# Patient Record
Sex: Female | Born: 1970 | Race: Black or African American | Hispanic: No | Marital: Married | Smoking: Never smoker
Health system: Southern US, Community
[De-identification: ages and names within clinical notes are randomized; demographics above are authoritative.]

---

## 2006-09-18 ENCOUNTER — Other Ambulatory Visit: Admission: RE | Admit: 2006-09-18 | Discharge: 2006-09-18 | Payer: Self-pay | Admitting: Obstetrics and Gynecology

## 2011-10-15 ENCOUNTER — Ambulatory Visit (INDEPENDENT_AMBULATORY_CARE_PROVIDER_SITE_OTHER): Payer: BC Managed Care – PPO | Admitting: Family Medicine

## 2011-10-15 VITALS — BP 124/66 | HR 65 | Temp 98.6°F | Resp 16 | Ht 68.5 in | Wt 215.4 lb

## 2011-10-15 DIAGNOSIS — R51 Headache: Secondary | ICD-10-CM

## 2011-10-15 DIAGNOSIS — B009 Herpesviral infection, unspecified: Secondary | ICD-10-CM

## 2011-10-15 MED ORDER — VALACYCLOVIR HCL 1 G PO TABS: 1000.0000 mg | ORAL_TABLET | Freq: Three times a day (TID) | ORAL | Status: DC

## 2011-10-15 NOTE — Progress Notes (Signed)
  Patient Name: Lindsay Mendez Date of Birth: 1970/11/14 Medical Record Number: [id number] Gender: female Date of Encounter: 10/15/2011  History of Present Illness:  Lindsay Mendez is a 41 y.o. very pleasant female patient who presents with the following:  She has a couple of concerns today 1. She notes a tender place in the center of her scalp for close to a month.  It is itchy and stings, but is tender to the touch.  She thinks there may have been a lesion or rash present, but this is now resolved.  2. This morning she noted blisters on the right side of her mouth, and also noted sore nodes on the right side of her neck .  She has had fever blisters in the past but not usually this bad.    Generally healthy.  LMP 09/28/11  There is no problem list on file for this patient.  No past medical history on file. No past surgical history on file. History  Substance Use Topics  . Smoking status: Never Smoker   . Smokeless tobacco: Not on file  . Alcohol Use: Not on file   No family history on file. Allergies  Allergen Reactions  . Penicillins Hives  . Ampicillin Hives  . Eggs Or Egg-Derived Products Nausea And Vomiting  . Septra (Bactrim)   . Sulfa Antibiotics Hives    Medication list has been reviewed and updated.  Review of Systems: As per HPI- otherwise negative. Of note no eye symptoms or pain   Physical Examination: Filed Vitals:   10/15/11 1617  BP: 124/66  Pulse: 65  Temp: 98.6 F (37 C)  TempSrc: Oral  Resp: 16  Height: 5' 8.5" (1.74 m)  Weight: 215 lb 6.4 oz (97.705 kg)    Body mass index is 32.28 kg/(m^2).  GEN: WDWN, NAD, Non-toxic, A & O x 3 HEENT: Atraumatic, Normocephalic. Neck supple. No masses, small tender nodes right cervical chain.  PEERL, eyes wnl.  Oropharynx and TM wnl also.   Ears and Nose: No external deformity. CV: RRR, No M/G/R. No JVD. No thrill. No extra heart sounds. PULM: CTA B, no wheezes, crackles, rhonchi. No retractions. No resp.  distress. No accessory muscle use. ABD: S, NT, ND EXTR: No c/c/e NEURO Normal gait.  PSYCH: Normally interactive. Conversant. Not depressed or anxious appearing.  Calm demeanor.  Cold sore right side of mouth.  Examined scalp and I can find no abnormality, lesion, or bump  Assessment and Plan: 1. 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  Therefore, will treat with valtrex at shingles dose.  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   She is to call if her scalp is not feeling better by the end of the week- Sooner if worse.

## 2011-10-21 ENCOUNTER — Encounter (HOSPITAL_COMMUNITY): Payer: Self-pay | Admitting: *Deleted

## 2011-10-21 ENCOUNTER — Emergency Department (HOSPITAL_COMMUNITY)
Admission: EM | Admit: 2011-10-21 | Discharge: 2011-10-22 | Disposition: A | Discharge status: Home / Self Care | Payer: BC Managed Care – PPO | Attending: Emergency Medicine | Admitting: Emergency Medicine

## 2011-10-21 DIAGNOSIS — S161XXA Strain of muscle, fascia and tendon at neck level, initial encounter: Secondary | ICD-10-CM

## 2011-10-21 DIAGNOSIS — S139XXA Sprain of joints and ligaments of unspecified parts of neck, initial encounter: Secondary | ICD-10-CM | POA: Insufficient documentation

## 2011-10-21 DIAGNOSIS — X58XXXA Exposure to other specified factors, initial encounter: Secondary | ICD-10-CM | POA: Insufficient documentation

## 2011-10-21 NOTE — ED Notes (Signed)
She has had a stiffneck since Monday.  She has severe pain with movement.  She was seen by her doctor and given med.  That med is not helping

## 2011-10-22 MED ORDER — IBUPROFEN 600 MG PO TABS: 600.0000 mg | ORAL_TABLET | Freq: Three times a day (TID) | ORAL | Status: AC | PRN

## 2011-10-22 MED ORDER — DIAZEPAM 5 MG PO TABS
5.0000 mg | ORAL_TABLET | Freq: Once | ORAL | Status: AC
  Administered 2011-10-22: 5 mg via ORAL
  Filled 2011-10-22: qty 1

## 2011-10-22 MED ORDER — HYDROCODONE-ACETAMINOPHEN 5-500 MG PO TABS: 1.0000 | ORAL_TABLET | Freq: Four times a day (QID) | ORAL | Status: AC | PRN

## 2011-10-22 MED ORDER — CYCLOBENZAPRINE HCL 10 MG PO TABS: 5.0000 mg | ORAL_TABLET | Freq: Three times a day (TID) | ORAL | Status: AC | PRN

## 2011-10-22 MED ORDER — OXYCODONE-ACETAMINOPHEN 5-325 MG PO TABS
1.0000 | ORAL_TABLET | Freq: Once | ORAL | Status: AC
  Administered 2011-10-22: 1 via ORAL
  Filled 2011-10-22: qty 1

## 2011-10-22 NOTE — ED Provider Notes (Signed)
History     CSN: [id number]  Arrival date & time 10/21/11  2223   First MD Initiated Contact with Patient 10/21/11 2359      Chief Complaint  Patient presents with  . neck stiffness      The history is provided by the patient.   the patient reports approximately one week of ongoing neck pain and neck stiffness in her left posterior neck and upper shoulder region.  She reports the pain is worse when she flexes at the neck and twists her head left and to the right.  She's had no recent trauma.  She's had no recent new exercise regimen.  She denies fevers or chills.  She has no weakness in her upper lower extremities.  She has no prior history of cancer or IV drug abuse.  She was seen by her physician prescribed ibuprofen.  She reports it helps with the pain but then the pain returns and she is unsure why the pain is returning.  She denies chest pain shortness of breath.  She denies exertional worsening of her symptoms.  Her pain is moderate at this time.  Nothing improves her symptoms  History reviewed. No pertinent past medical history.  History reviewed. No pertinent past surgical history.  No family history on file.  History  Substance Use Topics  . Smoking status: Never Smoker   . Smokeless tobacco: Not on file  . Alcohol Use: No    OB History    Grav Para Term Preterm Abortions TAB SAB Ect Mult Living                  Review of Systems  All other systems reviewed and are negative.    Allergies  Penicillins; Ampicillin; Eggs or egg-derived products; Septra; and Sulfa antibiotics  Home Medications   Current Outpatient Rx  Name Route Sig Dispense Refill  . IBUPROFEN 200 MG PO TABS Oral Take 400 mg by mouth every 6 (six) hours as needed. For pain    . CYCLOBENZAPRINE HCL 10 MG PO TABS Oral Take 0.5 tablets (5 mg total) by mouth 3 (three) times daily as needed for muscle spasms. 12 tablet 0  . HYDROCODONE-ACETAMINOPHEN 5-500 MG PO TABS Oral Take 1 tablet by mouth every  6 (six) hours as needed for pain. 20 tablet 0  . IBUPROFEN 600 MG PO TABS Oral Take 1 tablet (600 mg total) by mouth every 8 (eight) hours as needed for pain. 15 tablet 0    BP 115/60  Pulse 80  Temp(Src) 98.1 F (36.7 C) (Oral)  Resp 20  SpO2 100%  LMP 09/25/2011  Physical Exam  Nursing note and vitals reviewed. Constitutional: She is oriented to person, place, and time. She appears well-developed and well-nourished. No distress.  HENT:  Head: Normocephalic and atraumatic.  Eyes: EOM are normal.  Neck: Normal range of motion. Neck supple. No thyromegaly present.       Mild tenderness of left paracervical and upper left thoracic paraspinal muscles.  No cervical or thoracic spinal tenderness.  No rash noted  Cardiovascular: Normal rate, regular rhythm and normal heart sounds.   Pulmonary/Chest: Effort normal and breath sounds normal. No stridor.  Abdominal: Soft. She exhibits no distension. There is no tenderness.  Musculoskeletal: Normal range of motion.  Lymphadenopathy:    She has no cervical adenopathy.  Neurological: She is alert and oriented to person, place, and time.       5 out of 5 strength in bilateral upper  extremity major muscle groups  Skin: Skin is warm and dry.  Psychiatric: She has a normal mood and affect. Judgment normal.    ED Course  Procedures (including critical care time)  Labs Reviewed - No data to display No results found.   1. Cervical strain       MDM  Her symptoms seem consistent with cervical strain.  This is not an anginal equivalent.  She has normal strength in her upper extremities she walked in the ER without difficulty.  This is not a spinal cord issue.  Home with pain medicine and muscle relaxants and PCP followup.  She understands to return the emergency department for new or worsening symptoms        Lyanne Co, MD 10/22/11 0030

## 2011-10-22 NOTE — Discharge Instructions (Signed)

## 2012-05-08 ENCOUNTER — Ambulatory Visit (INDEPENDENT_AMBULATORY_CARE_PROVIDER_SITE_OTHER): Payer: BC Managed Care – PPO | Admitting: Family Medicine

## 2012-05-08 VITALS — BP 122/68 | HR 70 | Temp 98.1°F | Resp 16 | Ht 69.5 in | Wt 215.6 lb

## 2012-05-08 DIAGNOSIS — K047 Periapical abscess without sinus: Secondary | ICD-10-CM

## 2012-05-08 DIAGNOSIS — K0510 Chronic gingivitis, plaque induced: Secondary | ICD-10-CM

## 2012-05-08 MED ORDER — CLINDAMYCIN HCL 300 MG PO CAPS: 300.0000 mg | ORAL_CAPSULE | Freq: Three times a day (TID) | ORAL | Status: DC

## 2012-05-08 MED ORDER — TRAMADOL HCL 50 MG PO TABS: 50.0000 mg | ORAL_TABLET | Freq: Three times a day (TID) | ORAL | Status: DC | PRN

## 2012-05-08 MED ORDER — CLINDAMYCIN HCL 150 MG PO CAPS: 150.0000 mg | ORAL_CAPSULE | Freq: Three times a day (TID) | ORAL | Status: DC

## 2012-05-08 NOTE — Progress Notes (Signed)
[facility]:  Office Visit  Chief Complaint:  Chief Complaint  Patient presents with  . Dental Pain    Right upper side x 3dys  . Facial Swelling    x 3 dys  . Throat pain    when rying to swallow x 3 dys    HPI: Lindsay Mendez is a 41 y.o. female who complains of right sided jaw and dental pain since Monday. Has not tried anything for it except regular mouth wash. Has problems eating. No dentist. Denies SOB, CP, fevers, chills. Has tooth radiating into right jaw.   History reviewed. No pertinent past medical history. History reviewed. No pertinent past surgical history. History   Social History  . Marital Status: Unknown    Spouse Name: N/A    Number of Children: N/A  . Years of Education: N/A   Social History Main Topics  . Smoking status: Never Smoker   . Smokeless tobacco: None  . Alcohol Use: No  . Drug Use: No  . Sexually Active: Yes   Other Topics Concern  . None   Social History Narrative  . None   No family history on file. Allergies  Allergen Reactions  . Penicillins Hives  . Ampicillin Hives  . Eggs Or Egg-Derived Products Nausea And Vomiting  . Septra (Bactrim) Hives  . Sulfa Antibiotics Hives   Prior to Admission medications   Medication Sig Start Date End Date Taking? Authorizing Provider  ibuprofen (ADVIL,MOTRIN) 200 MG tablet Take 400 mg by mouth every 6 (six) hours as needed. For pain   Yes Historical Provider, MD  clindamycin (CLEOCIN) 150 MG capsule Take 1 capsule (150 mg total) by mouth 3 (three) times daily. 05/08/12   Elvie Maines P Michaelina Blandino, DO  clindamycin (CLEOCIN) 300 MG capsule Take 1 capsule (300 mg total) by mouth 3 (three) times daily. 05/08/12   Tonetta Napoles P Lavayah Vita, DO  traMADol (ULTRAM) 50 MG tablet Take 1 tablet (50 mg total) by mouth every 8 (eight) hours as needed for pain. 05/08/12   Herminio Kniskern P Itzelle Gains, DO     ROS: The patient denies fevers, chills, night sweats, unintentional weight loss, chest pain, palpitations, wheezing, dyspnea on  exertion, nausea, vomiting, abdominal pain, dysuria, hematuria, melena, numbness, weakness, or tingling.   All other systems have been reviewed and were otherwise negative with the exception of those mentioned in the HPI and as above.    PHYSICAL EXAM: Filed Vitals:   05/08/12 0949  BP: 122/68  Pulse: 70  Temp: 98.1 F (36.7 C)  Resp: 16   Filed Vitals:   05/08/12 0949  Height: 5' 9.5" (1.765 m)  Weight: 215 lb 9.6 oz (97.796 kg)   Body mass index is 31.38 kg/(m^2).  General: Alert, no acute distress HEENT:  Normocephalic, atraumatic, oropharynx patent. Tight upper molar gingivitis and dental caries. No SOB. No tonsillar swelling/edema.  Cardiovascular:  Regular rate and rhythm, no rubs murmurs or gallops.  No Carotid bruits, radial pulse intact. No pedal edema.  Respiratory: Clear to auscultation bilaterally.  No wheezes, rales, or rhonchi.  No cyanosis, no use of accessory musculature GI: No organomegaly, abdomen is soft and non-tender, positive bowel sounds.  No masses. Skin: No rashes. Neurologic: Facial musculature symmetric. Psychiatric: Patient is appropriate throughout our interaction. Lymphatic: No cervical lymphadenopathy Musculoskeletal: Gait intact.   LABS: No results found for this or any previous visit.   EKG/XRAY:   Primary read interpreted by Dr. Conley Rolls at [facility]   ASSESSMENT/PLAN: Encounter Diagnoses  Name Primary?  . Dental abscess Yes  . Gingivitis    Patient has PCN and Sulfa allergy Will give Clindamycin 450 mg PO TID x 10 days. We do not have 450 mg pills on out computer script so will give 150 mg and 300 mg for a total of 450 mg.  Tramdol for pain, salt water gargles Go see a dentist ASAP when swelling goes down If fever, more pain then f/u prn Consider changing to Doxycycline if Clindamycin is not effective?     Airiel Oblinger PHUONG, DO 05/08/2012 10:49 AM

## 2012-09-15 ENCOUNTER — Ambulatory Visit (INDEPENDENT_AMBULATORY_CARE_PROVIDER_SITE_OTHER): Payer: BC Managed Care – PPO | Admitting: Family Medicine

## 2012-09-15 VITALS — BP 134/84 | HR 82 | Temp 98.3°F | Resp 16 | Ht 69.5 in | Wt 221.4 lb

## 2012-09-15 NOTE — Patient Instructions (Signed)
We are going to refer you to an audiologist to help figure out the rining in your ear.   At that point we may need to send you to an Ear, Nose, and Throat doctor if you need it, depending on what they find.    For your headache, I think you need to have your eyes checked.  For the pain, continue taking the Ibuprofen for relief.

## 2012-09-15 NOTE — Progress Notes (Signed)
Lindsay Mendez is a 42 y.o. female who presents to [facility] Care today with complaints of headache and tinnitus:    1.  Headache:  Present for past 2-3 months.  Describes occipital pain occasionally radiates to frontal region of head.  Relieved with Ibuprofen.  Worse with driving, especially at night.  Sometimes has had some blurriness in her vision not resolved with her eyeglasses.  Still has to squint to read.  She had new glasses in December which were bifocals and she has been having trouble with headaches since then.  No changes in vision, nausea, vomiting.  Has headaches 2-3 times a week, lasts for an hour or so, then resolves on its own.    2.  Tinnitus:  Present for past 2-3 weeks.  Initially intermittent, now steady "ringing in my ears."  Not worse on either side.  No vertigo.  No ear pain.  Not pulsatile.  No recent illnesses.  No loss of hearing that she can tell.   PMH reviewed.  History reviewed. No pertinent past medical history. Past Surgical History  Procedure Laterality Date  . Cesarean section      Medications reviewed. Current Outpatient Prescriptions  Medication Sig Dispense Refill  . ibuprofen (ADVIL,MOTRIN) 200 MG tablet Take 400 mg by mouth every 6 (six) hours as needed. For pain      . clindamycin (CLEOCIN) 150 MG capsule Take 1 capsule (150 mg total) by mouth 3 (three) times daily.  30 capsule  0  . clindamycin (CLEOCIN) 300 MG capsule Take 1 capsule (300 mg total) by mouth 3 (three) times daily.  30 capsule  0  . traMADol (ULTRAM) 50 MG tablet Take 1 tablet (50 mg total) by mouth every 8 (eight) hours as needed for pain.  30 tablet  0   No current facility-administered medications for this visit.    ROS as above otherwise neg.  No chest pain, palpitations, SOB, Fever, Chills, Abd pain, N/V/D.   Physical Exam:  BP 134/84  Pulse 82  Temp(Src) 98.3 F (36.8 C) (Oral)  Resp 16  Ht 5' 9.5" (1.765 m)  Wt 221 lb 6.4 oz (100.426 kg)  BMI 32.24 kg/m2  SpO2 100%  LMP  08/18/2012 Gen:  Patient sitting on exam table, appears stated age in no acute distress Head: Normocephalic atraumatic Eyes: EOMI, PERRL, sclera and conjunctiva non-erythematous.  Fundoscopy WNL BL with clear cup to disc margins.     Ears:  Canals clear bilaterally.  TMs pearly gray bilaterally without erythema or bulging.  Auditory acuity equal bilaterally to whisper test and finger rub Nose:  Nasal turbinates without edema BL Mouth: Mucosa membranes moist. Tonsils +2, nonenlarged, non-erythematous. Neck: No cervical lymphadenopathy noted Heart:  RRR, no murmurs auscultated. Pulm:  Clear to auscultation bilaterally with good air movement.  No wheezes or rales noted.      Assessment and Plan:  1.  Headache: - No worrisome findings or red flags. - Continue Ibuprofen as this has helped in past.  - Recommend visit to optometrist to have vision checked as this seems to be trigger for headaches.   2. Tinnitus: - likely secondary to hearing loss - referral to audiology placed today. - unclear etiology, may eventually need ENT - no worrisome findings/symptoms.   - discussed diagnosis with patient and no treatment for now.

## 2012-09-18 ENCOUNTER — Ambulatory Visit (INDEPENDENT_AMBULATORY_CARE_PROVIDER_SITE_OTHER): Payer: BC Managed Care – PPO | Admitting: Family Medicine

## 2012-09-18 VITALS — BP 112/67 | HR 78 | Temp 98.1°F | Resp 18 | Ht 69.5 in | Wt 219.0 lb

## 2012-09-18 DIAGNOSIS — R51 Headache: Secondary | ICD-10-CM

## 2012-09-18 DIAGNOSIS — H9312 Tinnitus, left ear: Secondary | ICD-10-CM

## 2012-09-18 DIAGNOSIS — H9319 Tinnitus, unspecified ear: Secondary | ICD-10-CM

## 2012-09-18 MED ORDER — TRAMADOL HCL 50 MG PO TABS: 50.0000 mg | ORAL_TABLET | Freq: Three times a day (TID) | ORAL | Status: DC | PRN

## 2012-09-18 MED ORDER — TIZANIDINE HCL 2 MG PO TABS: ORAL_TABLET | ORAL | Status: DC

## 2012-09-18 NOTE — Progress Notes (Signed)
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 She started having this late last week, and symptoms have continued to persist. She has a left occipitoparietal headache down into her neck some. She's had a re\re in her ear. This is continued to persist. She has an appointment to see the audiologist next week. She came in here and was seen 3 days ago, felt not to have a migraine, and was treated with the ibuprofen it with a referral as noted. She's not been working the last few days. She has a history of having had headaches back in her school days. This is not a totally new phenomena to her, though the tkinnitus is different. She does admit distress but that was more back in December. She quit working at that time and has been on a break since then.  Objective: Patient appears to be in some discomfort. Her TMs appeared entirely normal. Eyes PERRLA. Fundi appear benign though I could not get any thorough look at the discs, only got brief glance is at them. Her throat was clear. Neck supple without nodes. Chest clear. Heart regular without murmurs. Cranial nerves intact. Motor symmetrical. Sensory grossly normal. Romberg negative.  Assessment: Headache, atypical in its prolonged nature and the associated ringing in the ears  Plan: Tramadol Tizanidine Due to the atypical nature of the headache I'm going ahead and trying to order an MRI on her.

## 2012-09-18 NOTE — Patient Instructions (Addendum)
Take the tramadol every 6 or 8 hours as needed for headaches.  Take the tizanidine one at bedtime. If well tolerated take one in the morning also. It will often make you drowsy.  We will schedule a brain scan for you, MRI, and someone will contact you about that in the next day or so.  If worse at anytime return here or go to the emergency room. If not feeling much better in 3 or 4 days return for recheck.

## 2012-10-02 ENCOUNTER — Telehealth: Payer: Self-pay | Admitting: Radiology

## 2012-10-02 NOTE — Telephone Encounter (Signed)
THIS PATIENT CAME TO OUR DOOR AFTER HOURS. SHE PRESENTED WITH EAR RINGING AND SINUS PRESSURE. SHE MENTIONED SHE MIGHT HAVE CHEST PAIN. UNDER THE DIRECTION OF SARAH WEBER, WE OFFERED TO CHECK HER VITAL SIGNS AND CALL AN AMBULANCE FOR HER, IF WARRANTED. SHE AGREED. HER VITALS WERE: B/P 120/78, PULSE 88, PULSE OX: 100%. WE DID TELL HER THAT HER VITALS WERE STABLE, BUT IF SHE FELT SHE NEEDED TREATMENT SHE SHOULD GO TO THE EMERGENCY ROOM. WE WOULD BE HAPPY TO SEE HER IN THE MORNING AT 7:30 AM. THE PATIENT LEFT WITH HER HUSBAND UNDERSTANDING OUR CONVERSATION.                           [facility] WITHERSPOON, R.T.(R)

## 2012-10-08 ENCOUNTER — Other Ambulatory Visit: Payer: Self-pay

## 2012-10-08 DIAGNOSIS — R51 Headache: Secondary | ICD-10-CM

## 2012-10-09 ENCOUNTER — Other Ambulatory Visit: Payer: Self-pay | Admitting: Otolaryngology

## 2012-10-09 DIAGNOSIS — R51 Headache: Secondary | ICD-10-CM

## 2012-10-10 ENCOUNTER — Other Ambulatory Visit: Payer: Self-pay | Admitting: Otolaryngology

## 2012-10-10 ENCOUNTER — Other Ambulatory Visit: Payer: BC Managed Care – PPO

## 2012-10-10 ENCOUNTER — Inpatient Hospital Stay: Admission: RE | Admit: 2012-10-10 | Payer: BC Managed Care – PPO | Source: Ambulatory Visit

## 2012-10-10 ENCOUNTER — Ambulatory Visit
Admission: RE | Admit: 2012-10-10 | Discharge: 2012-10-10 | Disposition: A | Discharge status: Home / Self Care | Payer: Self-pay | Source: Ambulatory Visit | Attending: Otolaryngology | Admitting: Otolaryngology

## 2012-10-10 DIAGNOSIS — H9319 Tinnitus, unspecified ear: Secondary | ICD-10-CM

## 2012-10-10 DIAGNOSIS — R51 Headache: Secondary | ICD-10-CM

## 2013-07-15 ENCOUNTER — Ambulatory Visit: Payer: BC Managed Care – PPO | Admitting: Family Medicine

## 2013-07-15 VITALS — BP 110/70 | HR 83 | Temp 99.9°F | Resp 16 | Ht 69.5 in | Wt 228.0 lb

## 2013-07-15 DIAGNOSIS — N926 Irregular menstruation, unspecified: Secondary | ICD-10-CM

## 2013-07-15 DIAGNOSIS — J209 Acute bronchitis, unspecified: Secondary | ICD-10-CM

## 2013-07-15 LAB — POCT URINE PREGNANCY: Preg Test, Ur: NEGATIVE

## 2013-07-15 MED ORDER — AZITHROMYCIN 250 MG PO TABS: ORAL_TABLET | ORAL | Status: DC

## 2013-07-15 MED ORDER — BENZONATATE 200 MG PO CAPS: 200.0000 mg | ORAL_CAPSULE | Freq: Three times a day (TID) | ORAL | Status: DC | PRN

## 2013-07-15 NOTE — Progress Notes (Signed)
°  This chart was scribed for Lindsay Sidle, MD by [name], ED Scribe. This patient was seen in room Room/bed 3 and the patient's care was started at 12:03 PM. Subjective:    Patient ID: Lindsay Mendez, female    DOB: 1971-01-01, 42 y.o.   MRN: [id number] Chief Complaint  Patient presents with   Cough    x 3 days   Fever   Possible Pregnancy   HPI Lindsay Mendez is a 42 y.o. female who presents to the [facility] complaining of ongoing cough, fever, and myalgias with a associated slight rhinorrhea that began 3 days ago. Pt states she was sick 2 weeks ago and states her symptoms have returned and worsened. Pt states she is having R otalgia and chest pain. She states she is having sore throat and states she is having dryness of the throat. Pt states she was taking OTC Tylenol Sinus for a week and had some relief.  Pt suspects she maybe pregnant. She states she has not had a menstrual cycle since October 2014.  Pt states she will begin working with [employer] [employer].  Review of Systems  Constitutional: Positive for fever.  HENT: Positive for ear pain and rhinorrhea.   Respiratory: Positive for cough.   Cardiovascular: Positive for chest pain.  Musculoskeletal: Positive for myalgias.   Objective:   Physical Exam  Nursing note and vitals reviewed. Constitutional: She is oriented to person, place, and time. She appears well-developed and well-nourished. No distress.  HENT:  Head: Normocephalic and atraumatic.  Right Ear: External ear normal.  Left Ear: External ear normal.  R TM dull in color.  Eyes: Conjunctivae are normal. Pupils are equal, round, and reactive to light.  Neck: Normal range of motion. Neck supple. No thyromegaly present.  Cardiovascular: Normal rate, regular rhythm and normal heart sounds.   No murmur heard. Pulmonary/Chest: Effort normal and breath sounds normal. She has no wheezes.  Few rhonchi in chest.  Abdominal: Bowel sounds are normal.  She exhibits no mass. There is no rebound and no guarding.  Musculoskeletal: Normal range of motion. She exhibits no tenderness.  Lymphadenopathy:    She has no cervical adenopathy.  Neurological: She is alert and oriented to person, place, and time. She has normal reflexes.  Skin: Skin is warm and dry. She is not diaphoretic.  Psychiatric: She has a normal mood and affect. Her behavior is normal.   Patient often misses her period this time years because of stress BP 110/70   Pulse 83   Temp(Src) 99.9 F (37.7 C) (Oral)   Resp 16   Ht 5' 9.5" (1.765 m)   Wt 228 lb (103.42 kg)   BMI 33.20 kg/m2   SpO2 99%   LMP 05/14/2013 Assessment & Plan:   1. Acute bronchitis   2. Missed period     Acute bronchitis - Plan: azithromycin (ZITHROMAX Z-PAK) 250 MG tablet, benzonatate (TESSALON) 200 MG capsule  Missed period - Plan: POCT urine pregnancy  Signed, Lindsay Sidle, MD

## 2013-07-15 NOTE — Patient Instructions (Signed)

## 2014-09-17 ENCOUNTER — Ambulatory Visit (INDEPENDENT_AMBULATORY_CARE_PROVIDER_SITE_OTHER): Payer: Self-pay | Admitting: Family Medicine

## 2014-09-17 VITALS — BP 118/72 | HR 73 | Temp 97.9°F | Resp 18 | Ht 69.5 in | Wt 231.0 lb

## 2014-09-17 DIAGNOSIS — Z0000 Encounter for general adult medical examination without abnormal findings: Secondary | ICD-10-CM

## 2014-09-17 NOTE — Progress Notes (Signed)
   Subjective:    Patient ID: Lindsay Mendez, female    DOB: 11/13/1970, 44 y.o.   MRN: [id number]  This chart was scribed for Elvina SidleKurt Reginal Wojcicki, MD, by Lindsay Cosieronney LionSuzanne Le, ED Scribe. This patient was seen in room 11 and the patient's care was started at 11:02 AM.   Chief Complaint  Patient presents with  . Employment Physical    has form     HPI HPI Comments: Lindsay Mendez is a 44 y.o. female who presents to the [facility] for an employment physical. She is asymptomatic at this time.   Patient however reports having had amenorrhea for the past 6-8 months. She thinks it may be due to the stress of her mom passing and other life stresses.  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 Patient is from [place].   Review of Systems  Genitourinary: Positive for menstrual problem.       Patient reports irregular periods for the past 6-8 months.       Objective:   Physical Exam  Constitutional: She is oriented to person, place, and time. She appears well-developed and well-nourished. No distress.  HENT:  Head: Normocephalic and atraumatic.  Eyes: Conjunctivae and EOM are normal.  Neck: Neck supple. No tracheal deviation present.  Cardiovascular: Normal rate.   Pulmonary/Chest: Effort normal. No respiratory distress.  Abdominal: Soft. She exhibits no mass. There is no hepatomegaly. There is no tenderness.  Musculoskeletal: Normal range of motion.  Neurological: She is alert and oriented to person, place, and time.  Skin: Skin is warm and dry.  Psychiatric: She has a normal mood and affect. Her behavior is normal.  Nursing note and vitals reviewed.      Assessment & Plan:     This chart was scribed in my presence and reviewed by me personally.    ICD-9-CM ICD-10-CM   1. Annual physical exam V70.0 Z00.00      Signed, Elvina SidleKurt Karin Pinedo, MD

## 2014-09-17 NOTE — Patient Instructions (Signed)

## 2014-09-17 NOTE — Progress Notes (Signed)
  Tuberculosis Risk Questionnaire  1. Yes  Were you born outside the [place] in one of the following parts of the world: [place], [place], [place], [place] America or Eastern [place]?    2. Yes  Have you traveled outside the [place] and lived for more than one month in one of the following parts of the world: [place], [place], [place], [place] or [place]?    3. No Do you have a compromised immune system such as from any of the following conditions:HIV/AIDS, organ or bone marrow transplantation, diabetes, immunosuppressive medicines (e.g. Prednisone, Remicaide), leukemia, lymphoma, cancer of the head or neck, gastrectomy or jejunal bypass, end-stage renal disease (on dialysis), or silicosis?     4. No Have you ever or do you plan on working in: a residential care center, a health care facility, a jail or prison or homeless shelter?    5. No Have you ever: injected illegal drugs, used crack cocaine, lived in a homeless shelter  or been in jail or prison?     6. No Have you ever been exposed to anyone with infectious tuberculosis?    Tuberculosis Symptom Questionnaire  Do you currently have any of the following symptoms?  1. No Unexplained cough lasting more than 3 weeks?   2. No Unexplained fever lasting more than 3 weeks.   3. No Night Sweats (sweating that leaves the bedclothes and sheets wet)     4. No Shortness of Breath   5. No  Chest Pain   6. No Unintentional weight loss    7. No Unexplained fatigue (very tired for no reason)   

## 2014-12-06 ENCOUNTER — Encounter: Payer: Self-pay | Admitting: Physician Assistant

## 2014-12-06 ENCOUNTER — Ambulatory Visit (INDEPENDENT_AMBULATORY_CARE_PROVIDER_SITE_OTHER): Payer: BLUE CROSS/BLUE SHIELD | Admitting: Physician Assistant

## 2014-12-06 ENCOUNTER — Ambulatory Visit (INDEPENDENT_AMBULATORY_CARE_PROVIDER_SITE_OTHER): Payer: BLUE CROSS/BLUE SHIELD

## 2014-12-06 VITALS — BP 112/72 | HR 59 | Temp 97.6°F | Resp 16 | Ht 69.54 in | Wt 238.0 lb

## 2014-12-06 DIAGNOSIS — M542 Cervicalgia: Secondary | ICD-10-CM | POA: Diagnosis not present

## 2014-12-06 DIAGNOSIS — N926 Irregular menstruation, unspecified: Secondary | ICD-10-CM

## 2014-12-06 DIAGNOSIS — M6248 Contracture of muscle, other site: Secondary | ICD-10-CM

## 2014-12-06 DIAGNOSIS — M62838 Other muscle spasm: Secondary | ICD-10-CM

## 2014-12-06 LAB — POCT URINE PREGNANCY: Preg Test, Ur: NEGATIVE

## 2014-12-06 MED ORDER — CYCLOBENZAPRINE HCL 5 MG PO TABS: 5.0000 mg | ORAL_TABLET | Freq: Three times a day (TID) | ORAL | Status: DC | PRN

## 2014-12-06 MED ORDER — DICLOFENAC SODIUM 75 MG PO TBEC: 75.0000 mg | DELAYED_RELEASE_TABLET | Freq: Two times a day (BID) | ORAL | Status: DC

## 2014-12-06 NOTE — Progress Notes (Signed)
Subjective:    Patient ID: Lindsay Mendez, female    DOB: 10/10/1970, 44 y.o.   MRN: [id number]  HPI Pt presents to clinic with right sided neck and shoulder muscle pain. She has had this over the past year but it is intermittent.  This current episode has been present for 3 days.  She was given robaxin for another muscle injury but it does not seem to be helping her current pain.  She does not have weakness or paresthesias in her right arm/hand.  She has had no injury that she knows of to her neck or her shoulder.  She had been walking and she found that when she walked this pain resolved and then over the last 3-4 months she has not been able to walk due to her schedule and her pain has gotten worse during this time.  Right handed  Teacher   Home treatments - motrin without help   Review of Systems  Musculoskeletal: Positive for neck pain.   There are no active problems to display for this patient.  Prior to Admission medications   Medication Sig Start Date End Date Taking? Authorizing Provider  ibuprofen (ADVIL,MOTRIN) 200 MG tablet Take 400 mg by mouth every 6 (six) hours as needed. For pain   Yes Historical Provider, MD  methocarbamol (ROBAXIN) 750 MG tablet Take 750 mg by mouth 4 (four) times daily.   Yes Historical Provider, MD   Allergies  Allergen Reactions  . Penicillins Hives  . Ampicillin Hives  . Eggs Or Egg-Derived Products Nausea And Vomiting  . Septra [Bactrim] Hives  . Sulfa Antibiotics Hives    Medications, allergies, past medical history, surgical history, family history, social history and problem list reviewed and updated.      Objective:   Physical Exam  Constitutional: She is oriented to person, place, and time. She appears well-developed and well-nourished.  BP 112/72 mmHg  Pulse 59  Temp(Src) 97.6 F (36.4 C) (Oral)  Resp 16  Ht 5' 9.54" (1.766 m)  Wt 238 lb (107.956 kg)  BMI 34.62 kg/m2  SpO2 97%  LMP 09/22/2014   HENT:  Head: Normocephalic  and atraumatic.  Right Ear: External ear normal.  Left Ear: External ear normal.  Pulmonary/Chest: Effort normal.  Musculoskeletal:       Right shoulder: Normal.       Cervical back: She exhibits tenderness (right sided) and spasm (trapezius TTP and muscle spasms palpable). She exhibits no bony tenderness.       Back:  Neurological: She is alert and oriented to person, place, and time.  Skin: Skin is warm and dry.  Psychiatric: She has a normal mood and affect. Her behavior is normal. Judgment and thought content normal.   [facility] reading (PRIMARY) by  Dr. Milus GlazierLauenstein. No disc space narrowing. decreased lordosis.      Assessment & Plan:  Neck pain - Plan: DG Cervical Spine 2 or 3 views, diclofenac (VOLTAREN) 75 MG EC tablet, Ambulatory referral to Chiropractic, CANCELED: DG CERVICAL SPINE 2 VIEW  Irregular menses - Plan: POCT urine pregnancy  Muscle spasms of neck - Plan: cyclobenzaprine (FLEXERIL) 5 MG tablet, Ambulatory referral to Chiropractic   Heat to the area and gentle stretches.  She will try and start walking. She will call her insurance to make sure she has coverage for chiropractic and if she does not have coverage she will want PT.  Benny LennertSarah Tishawna Larouche PA-C  [facility] 12/06/2014 11:14 AM

## 2014-12-06 NOTE — Addendum Note (Signed)
Addended by: Cydney OkAUGUSTIN, Yaeko Fazekas N on: 12/06/2014 12:12 PM   Modules accepted: Orders

## 2014-12-06 NOTE — Addendum Note (Signed)
Addended by: Cydney OkAUGUSTIN, Monica Codd N on: 12/06/2014 12:08 PM   Modules accepted: Orders

## 2014-12-06 NOTE — Patient Instructions (Signed)
[facility]  [facility]

## 2015-04-22 ENCOUNTER — Other Ambulatory Visit: Payer: Self-pay | Admitting: Obstetrics and Gynecology

## 2015-04-22 DIAGNOSIS — R928 Other abnormal and inconclusive findings on diagnostic imaging of breast: Secondary | ICD-10-CM

## 2015-04-29 ENCOUNTER — Ambulatory Visit
Admission: RE | Admit: 2015-04-29 | Discharge: 2015-04-29 | Disposition: A | Discharge status: Home / Self Care | Payer: BLUE CROSS/BLUE SHIELD | Source: Ambulatory Visit | Attending: Obstetrics and Gynecology | Admitting: Obstetrics and Gynecology

## 2015-04-29 DIAGNOSIS — R928 Other abnormal and inconclusive findings on diagnostic imaging of breast: Secondary | ICD-10-CM

## 2016-01-06 ENCOUNTER — Ambulatory Visit (INDEPENDENT_AMBULATORY_CARE_PROVIDER_SITE_OTHER): Payer: Self-pay | Admitting: Family Medicine

## 2016-01-06 VITALS — BP 124/78 | HR 73 | Temp 98.2°F | Resp 16 | Ht 69.54 in | Wt 246.0 lb

## 2016-01-06 DIAGNOSIS — R51 Headache: Secondary | ICD-10-CM

## 2016-01-06 DIAGNOSIS — R29898 Other symptoms and signs involving the musculoskeletal system: Secondary | ICD-10-CM

## 2016-01-06 DIAGNOSIS — R42 Dizziness and giddiness: Secondary | ICD-10-CM

## 2016-01-06 DIAGNOSIS — R600 Localized edema: Secondary | ICD-10-CM

## 2016-01-06 DIAGNOSIS — H9319 Tinnitus, unspecified ear: Secondary | ICD-10-CM

## 2016-01-06 DIAGNOSIS — R519 Headache, unspecified: Secondary | ICD-10-CM

## 2016-01-06 LAB — COMPREHENSIVE METABOLIC PANEL
ALBUMIN: 4 g/dL (ref 3.6–5.1)
ALK PHOS: 108 U/L (ref 33–115)
ALT: 11 U/L (ref 6–29)
AST: 18 U/L (ref 10–30)
BILIRUBIN TOTAL: 0.3 mg/dL (ref 0.2–1.2)
BUN: 10 mg/dL (ref 7–25)
CO2: 23 mmol/L (ref 20–31)
CREATININE: 0.58 mg/dL (ref 0.50–1.10)
Calcium: 8.8 mg/dL (ref 8.6–10.2)
Chloride: 107 mmol/L (ref 98–110)
Glucose, Bld: 79 mg/dL (ref 65–99)
Potassium: 4.2 mmol/L (ref 3.5–5.3)
SODIUM: 140 mmol/L (ref 135–146)
TOTAL PROTEIN: 6.6 g/dL (ref 6.1–8.1)

## 2016-01-06 LAB — POCT CBC
Granulocyte percent: 58.8 %G (ref 37–80)
HCT, POC: 31.9 % — AB (ref 37.7–47.9)
Hemoglobin: 11.3 g/dL — AB (ref 12.2–16.2)
LYMPH, POC: 1.2 (ref 0.6–3.4)
MCH, POC: 29.5 pg (ref 27–31.2)
MCHC: 35.6 g/dL — AB (ref 31.8–35.4)
MCV: 82.7 fL (ref 80–97)
MID (CBC): 0.2 (ref 0–0.9)
MPV: 7.5 fL (ref 0–99.8)
POC Granulocyte: 2.1 (ref 2–6.9)
POC LYMPH PERCENT: 34.7 %L (ref 10–50)
POC MID %: 6.5 % (ref 0–12)
Platelet Count, POC: 260 10*3/uL (ref 142–424)
RBC: 3.85 M/uL — AB (ref 4.04–5.48)
RDW, POC: 13.7 %
WBC: 3.6 10*3/uL — AB (ref 4.6–10.2)

## 2016-01-06 LAB — TSH: TSH: 1.67 mIU/L

## 2016-01-06 LAB — D-DIMER, QUANTITATIVE (NOT AT ARMC): D DIMER QUANT: 0.45 ug{FEU}/mL (ref <0.50)

## 2016-01-06 MED ORDER — DOXYCYCLINE HYCLATE 100 MG PO CAPS: 100.0000 mg | ORAL_CAPSULE | Freq: Two times a day (BID) | ORAL | Status: AC

## 2016-01-06 MED ORDER — PREDNISONE 20 MG PO TABS: ORAL_TABLET | ORAL | Status: AC

## 2016-01-06 MED ORDER — FLUTICASONE PROPIONATE 50 MCG/ACT NA SUSP: 2.0000 | Freq: Every day | NASAL | Status: AC

## 2016-01-06 MED ORDER — HYDROCODONE-ACETAMINOPHEN 5-325 MG PO TABS: 1.0000 | ORAL_TABLET | Freq: Four times a day (QID) | ORAL | Status: AC | PRN

## 2016-01-06 NOTE — Patient Instructions (Addendum)
IF you received an x-ray today, you will receive an invoice from [facility]. Please contact [facility] at [phone] with questions or concerns regarding your invoice.   IF you received labwork today, you will receive an invoice from [employer]. Please contact [facility] at [phone] with questions or concerns regarding your invoice.   Our billing staff will not be able to assist you with questions regarding bills from these companies.  You will be contacted with the lab results as soon as they are available. The fastest way to get your results is to activate your My Chart account. Instructions are located on the last page of this paperwork. If you have not heard from [place] regarding the results in 2 weeks, please contact this office.     Vertigo Vertigo means you feel like you or your surroundings are moving when they are not. Vertigo can be dangerous if it occurs when you are at work, driving, or performing difficult activities.  CAUSES  Vertigo occurs when there is a conflict of signals sent to your brain from the visual and sensory systems in your body. There are many different causes of vertigo, including:  Infections, especially in the inner ear.  A bad reaction to a drug or misuse of alcohol and medicines.  Withdrawal from drugs or alcohol.  Rapidly changing positions, such as lying down or rolling over in bed.  A migraine headache.  Decreased blood flow to the brain.  Increased pressure in the brain from a head injury, infection, tumor, or bleeding. SYMPTOMS  You may feel as though the world is spinning around or you are falling to the ground. Because your balance is upset, vertigo can cause nausea and vomiting. You may have involuntary eye movements (nystagmus). DIAGNOSIS  Vertigo is usually diagnosed by physical exam. If the cause of your vertigo is unknown, your caregiver may perform imaging tests, such as an MRI scan  (magnetic resonance imaging). TREATMENT  Most cases of vertigo resolve on their own, without treatment. Depending on the cause, your caregiver may prescribe certain medicines. If your vertigo is related to body position issues, your caregiver may recommend movements or procedures to correct the problem. In rare cases, if your vertigo is caused by certain inner ear problems, you may need surgery. HOME CARE INSTRUCTIONS   Follow your caregiver's instructions.  Avoid driving.  Avoid operating heavy machinery.  Avoid performing any tasks that would be dangerous to you or others during a vertigo episode.  Tell your caregiver if you notice that certain medicines seem to be causing your vertigo. Some of the medicines used to treat vertigo episodes can actually make them worse in some people. SEEK IMMEDIATE MEDICAL CARE IF:   Your medicines do not relieve your vertigo or are making it worse.  You develop problems with talking, walking, weakness, or using your arms, hands, or legs.  You develop severe headaches.  Your nausea or vomiting continues or gets worse.  You develop visual changes.  A family member notices behavioral changes.  Your condition gets worse. MAKE SURE YOU:  Understand these instructions.  Will watch your condition.  Will get help right away if you are not doing well or get worse.   This information is not intended to replace advice given to you by your health care provider. Make sure you discuss any questions you have with your health care provider.   Document Released: 04/18/2005 Document Revised: 10/01/2011 Document Reviewed: 11/01/2014 Elsevier Interactive Patient  Education 2016 [employer] Tinnitus Tinnitus refers to hearing a sound when there is no actual source for that sound. This is often described as ringing in the ears. However, people with this condition may hear a variety of noises. A person may hear the sound in one ear or in both ears.  The  sounds of tinnitus can be soft, loud, or somewhere in between. Tinnitus can last for a few seconds or can be constant for days. It may go away without treatment and come back at various times. When tinnitus is constant or happens often, it can lead to other problems, such as trouble sleeping and trouble concentrating. Almost everyone experiences tinnitus at some point. Tinnitus that is long-lasting (chronic) or comes back often is a problem that may require medical attention.  CAUSES  The cause of tinnitus is often not known. In some cases, it can result from other problems or conditions, including:   Exposure to loud noises from machinery, music, or other sources.  Hearing loss.  Ear or sinus infections.  Earwax buildup.  A foreign object in the ear.  Use of certain medicines.  Use of alcohol and caffeine.  High blood pressure.  Heart diseases.  Anemia.  Allergies.  Meniere disease.  Thyroid problems.  Tumors.  An enlarged part of a weakened blood vessel (aneurysm). SYMPTOMS The main symptom of tinnitus is hearing a sound when there is no source for that sound. It may sound like:   Buzzing.  Roaring.  Ringing.  Blowing air, similar to the sound heard when you listen to a seashell.  Hissing.  Whistling.  Sizzling.  Humming.  Running water.  A sustained musical note. DIAGNOSIS  Tinnitus is diagnosed based on your symptoms. Your health care provider will do a physical exam. A comprehensive hearing exam (audiologic exam) will be done if your tinnitus:   Affects only one ear (unilateral).  Causes hearing difficulties.  Lasts 6 months or longer. You may also need to see a health care provider who specializes in hearing disorders (audiologist). You may be asked to complete a questionnaire to determine the severity of your tinnitus. Tests may be done to help determine the cause and to rule out other conditions. These can include:  Imaging studies of your  head and brain, such as:  A CT scan.  An MRI.  An imaging study of your blood vessels (angiogram). TREATMENT  Treating an underlying medical condition can sometimes make tinnitus go away. If your tinnitus continues, other treatments may include:  Medicines, such as certain antidepressants or sleeping aids.  Sound generators to mask the tinnitus. These include:  Tabletop sound machines that play relaxing sounds to help you fall asleep.  Wearable devices that fit in your ear and play sounds or music.  A small device that uses headphones to deliver a signal embedded in music (acoustic neural stimulation). In time, this may change the pathways of your brain and make you less sensitive to tinnitus. This device is used for very severe cases when no other treatment is working.  Therapy and counseling to help you manage the stress of living with tinnitus.  Using hearing aids or cochlear implants, if your tinnitus is related to hearing loss. HOME CARE INSTRUCTIONS  When possible, avoid being in loud places and being exposed to loud sounds.  Wear hearing protection, such as earplugs, when you are exposed to loud noises.  Do not take stimulants, such as nicotine, alcohol, or caffeine.  Practice techniques for reducing  stress, such as meditation, yoga, or deep breathing.  Use a white noise machine, a humidifier, or other devices to mask the sound of tinnitus.  Sleep with your head slightly raised. This may reduce the impact of tinnitus.  Try to get plenty of rest each night. SEEK MEDICAL CARE IF:  You have tinnitus in just one ear.  Your tinnitus continues for 3 weeks or longer without stopping.  Home care measures are not helping.  You have tinnitus after a head injury.  You have tinnitus along with any of the following:  Dizziness.  Loss of balance.  Nausea and vomiting.   This information is not intended to replace advice given to you by your health care provider. Make  sure you discuss any questions you have with your health care provider.   Document Released: 07/09/2005 Document Revised: 07/30/2014 Document Reviewed: 12/09/2013 Elsevier Interactive Patient Education [employer]

## 2016-01-06 NOTE — Progress Notes (Deleted)
   Patient ID: Lindsay CosierIjeoma Mendez, female    DOB: 09/22/1970, 45 y.o.   MRN: [id number]  PCP: No PCP Per Patient  Subjective:   Chief Complaint  Patient presents with  . Dizziness  . Nausea    HPI Presents for evaluation of ***.  ***.    Review of Systems     There are no active problems to display for this patient.    Prior to Admission medications   Medication Sig Start Date End Date Taking? Authorizing Provider  ibuprofen (ADVIL,MOTRIN) 200 MG tablet Take 400 mg by mouth every 6 (six) hours as needed. Reported on 01/06/2016   Yes Historical Provider, MD  methocarbamol (ROBAXIN) 750 MG tablet Take 750 mg by mouth 4 (four) times daily. Reported on 01/06/2016    Historical Provider, MD     Allergies  Allergen Reactions  . Penicillins Hives  . Ampicillin Hives  . Eggs Or Egg-Derived Products Nausea And Vomiting  . Septra [Bactrim] Hives  . Sulfa Antibiotics Hives       Objective:  Physical Exam         Assessment & Plan:   ***

## 2016-01-06 NOTE — Progress Notes (Signed)
Subjective:  By signing my name below, I, Raven Small, attest that this documentation has been prepared under the direction and in the presence of Delman Cheadle , MD.  Electronically Signed: Thea Alken, ED Scribe. 01/06/2016. 1:58 PM.   Patient ID: Lindsay Mendez, female    DOB: 08/18/1970, 45 y.o.   MRN: [id number]  HPI Chief Complaint  Patient presents with  . Dizziness  . Nausea    HPI Comments: Lindsay Mendez is a 45 y.o. female who presents to the [facility] complaining of sudden dizziness that began this morning. Pt woke up this morning with room spinning dizziness that last for about 2 hour. She reports associated nausea, sinus pressure and an intermittent occipital HA 1 week ago. Pt reports hx of tinnitus since 2014. She had an MRI Brain without contrast for tinnitus 09/2012 which revealed mild enlargement of left seventh cranial nerve compared to right. Neurosurgeon thought enlarged facial nerve on right was unlikely unrelated to the tinnitus but was unable to tell due to patient refusing contrast. They thought likely ringing in ears was brought by noise or TMJ, clenching teeth at night. She reports being evaluated but ENT in the past and having a normal exam. There is no note found in Care everywhere of this visit. She mentions left lower leg weakness yesterday but this has resolved. She was seen by optho last week, had a normal exam but was noted to changes in vision. She denies flashers or floater. She denies decreased hearing. She occasionally has back pain.  Pt has been told she is anemic and low white cell thought to be due to her ethnicity and where she is from. Pt is from [place]. Her entire family has vertigo and tinnitus.  Last blood work was 1 year ago with her OBGYN.  There are no active problems to display for this patient.  No past medical history on file. Past Surgical History  Procedure Laterality Date  . Cesarean section     Allergies  Allergen  Reactions  . Penicillins Hives  . Ampicillin Hives  . Eggs Or Egg-Derived Products Nausea And Vomiting  . Septra [Bactrim] Hives  . Sulfa Antibiotics Hives   Prior to Admission medications   Medication Sig Start Date End Date Taking? Authorizing Provider  ibuprofen (ADVIL,MOTRIN) 200 MG tablet Take 400 mg by mouth every 6 (six) hours as needed. Reported on 01/06/2016   Yes Historical Provider, MD  methocarbamol (ROBAXIN) 750 MG tablet Take 750 mg by mouth 4 (four) times daily. Reported on 01/06/2016    Historical Provider, MD   Social History   Social History  . Marital Status: Married    Spouse Name: N/A  . Number of Children: N/A  . Years of Education: N/A   Occupational History  . Not on file.   Social History Main Topics  . Smoking status: Never Smoker   . Smokeless tobacco: Not on file  . Alcohol Use: No  . Drug Use: No  . Sexual Activity: Yes   Other Topics Concern  . Not on file   Social History Narrative   Review of Systems  HENT: Positive for sinus pressure and tinnitus. Negative for ear pain.   Eyes: Negative for photophobia and visual disturbance.  Gastrointestinal: Positive for nausea. Negative for vomiting.  Musculoskeletal: Negative for myalgias and gait problem.  Skin: Negative for rash and wound.  Neurological: Positive for dizziness, weakness and headaches.    Objective:   Physical Exam  Constitutional: She is oriented to person, place, and time. She appears well-developed and well-nourished. No distress.  HENT:  Head: Normocephalic and atraumatic.  Right Ear: Hearing, tympanic membrane, external ear and ear canal normal.  Left Ear: Hearing, external ear and ear canal normal. Tympanic membrane is retracted.  Nose: Nose normal.  Mouth/Throat: Uvula is midline, oropharynx is clear and moist and mucous membranes are normal.  Eyes: Conjunctivae and EOM are normal. Right eye exhibits no nystagmus. Left eye exhibits no nystagmus.  Negative dix hallpike  bilaterally   Neck: Neck supple. No thyromegaly present.  Cardiovascular: Normal rate, regular rhythm and normal heart sounds.   No murmur heard. Pulmonary/Chest: Effort normal and breath sounds normal. She has no wheezes. She has no rales.  Musculoskeletal: Normal range of motion.    Pt perceives soreness to mid aspect of mid tibia. She also perceives swelling on lateral left epicondyle. Both normal to exam. 4+/5 throughout except left hip flexor is 4/5.  LLE .5 to 3/4 of adn inch larger than RLE.  Lymphadenopathy:    She has cervical adenopathy (anterior, right).       Right cervical: No posterior cervical adenopathy present.      Left cervical: No posterior cervical adenopathy present.       Right: No supraclavicular adenopathy present.       Left: No supraclavicular adenopathy present.  Neurological: She is alert and oriented to person, place, and time. No cranial nerve deficit. She displays a negative Romberg sign. She displays no Babinski's sign (down going) on the right side. She displays no Babinski's sign (down going) on the left side.  Reflex Scores:      Patellar reflexes are 2+ on the right side and 3+ on the left side.      Achilles reflexes are 2+ on the right side and 2+ on the left side. Normal tandem gait. Negative pronator drift. No clones. Difficulty with finger to nose particularly with left hand on left side of the body. Rapid alternating movement was normal though had more error when sustained on left side.   Skin: Skin is warm and dry.  Psychiatric: She has a normal mood and affect. Her behavior is normal.  Nursing note and vitals reviewed.  Filed Vitals:   01/06/16 1241  BP: 124/78  Pulse: 73  Temp: 98.2 F (36.8 C)  TempSrc: Oral  Resp: 16  Height: 5' 9.54" (1.766 m)  Weight: 246 lb (111.585 kg)  SpO2: 98%    Results for orders placed or performed in visit on 01/06/16  POCT CBC  Result Value Ref Range   WBC 3.6 (A) 4.6 - 10.2 K/uL   Lymph, poc 1.2 0.6 -  3.4   POC LYMPH PERCENT 34.7 10 - 50 %L   MID (cbc) 0.2 0 - 0.9   POC MID % 6.5 0 - 12 %M   POC Granulocyte 2.1 2 - 6.9   Granulocyte percent 58.8 37 - 80 %G   RBC 3.85 (A) 4.04 - 5.48 M/uL   Hemoglobin 11.3 (A) 12.2 - 16.2 g/dL   HCT, POC 31.9 (A) 37.7 - 47.9 %   MCV 82.7 80 - 97 fL   MCH, POC 29.5 27 - 31.2 pg   MCHC 35.6 (A) 31.8 - 35.4 g/dL   RDW, POC 13.7 %   Platelet Count, POC 260 142 - 424 K/uL   MPV 7.5 0 - 99.8 fL   Assessment & Plan:   1. Vertigo   2. Tinnitus,  unspecified laterality   3. Left leg weakness   4. Acute nonintractable headache, unspecified headache type   5. 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 Also very reassuring that pt reports a numerous amount of her family members also suffer from chronic vertigo and tinnitus yet live to their 100s!  Pt's tinnitus has been present since 2014 and evaluated by ENT as well as neurosurgery - notes reviewed.  It appears that she would not consent to contrast with the MRI leading to suboptimal imaging - there are several abnormalities present on it but nothing that looked overly concerning or significant. Pt's vertigo is less severe than a similar self-limited episode prior.  NOT consistent with BPPV. Certainly Meniere's comes to mind with the tinnitus but no changes in hearting, no URI sxs. Will treat for a sinus infection consider pt's sinus pressure and HAs and then recheck in 10d - if sxs still persist, will need to see neuro and likely repeat MRI.  Pt agreeable to plan.  Pt is concerned about the anterior left lower leg pain and swelling without injury - left leg does measure sig larger than right on sev attempts so will check stat d-dimer - pt is aware that if this is + she will need to go to the ER tonight for further eval.   Pt's description of the sudden electrical shock sensation down her left leg combined with her  hyperactive left patellar DTR does make me concerned that there could be some more serious underlying neurologic process such as MS - check labs - esr, tsh - and refer to neuro. Recheck in 10d after prednisone taper is complete.  Orders Placed This Encounter  Procedures  . TSH  . Comprehensive metabolic panel  . Sedimentation Rate  . D-dimer, quantitative (not at [facility])  . Ambulatory referral to Neurology    Referral Priority:  Routine    Referral Type:  Consultation    Referral Reason:  Specialty Services Required    Requested Specialty:  Neurology    Number of Visits Requested:  1  . POCT CBC    Meds ordered this encounter  Medications  . predniSONE (DELTASONE) 20 MG tablet    Sig: Take 3 tabs po qd x3d, 2 tabs po qd x 3d, 1 tab po qd x 3d.    Dispense:  18 tablet    Refill:  0  . doxycycline (VIBRAMYCIN) 100 MG capsule    Sig: Take 1 capsule (100 mg total) by mouth 2 (two) times daily.    Dispense:  14 capsule    Refill:  0  . fluticasone (FLONASE) 50 MCG/ACT nasal spray    Sig: Place 2 sprays into both nostrils at bedtime.    Dispense:  16 g    Refill:  2  . HYDROcodone-acetaminophen (NORCO/VICODIN) 5-325 MG tablet    Sig: Take 1 tablet by mouth every 6 (six) hours as needed for moderate pain.    Dispense:  30 tablet    Refill:  0    I personally performed the services described in this documentation, which was scribed in my presence. The recorded information has been reviewed and considered, and addended by me as needed.   Delman Cheadle, M.D.  [facility] [address] [place],  [address] [phone] phone [phone] fax  01/06/2016 7:04 PM

## 2016-01-07 LAB — SEDIMENTATION RATE: SED RATE: 12 mm/h (ref 0–20)

## 2016-01-31 ENCOUNTER — Ambulatory Visit: Payer: Self-pay | Admitting: Neurology

## 2016-03-14 ENCOUNTER — Ambulatory Visit: Payer: Self-pay

## 2017-01-06 IMAGING — CR DG CERVICAL SPINE 2 OR 3 VIEWS
4 series · 4 of 4 positions shown · non-contrast
Comparison: None.

CLINICAL DATA: Neck pain

EXAM:
CERVICAL SPINE - 2-3 VIEW

[lateral]
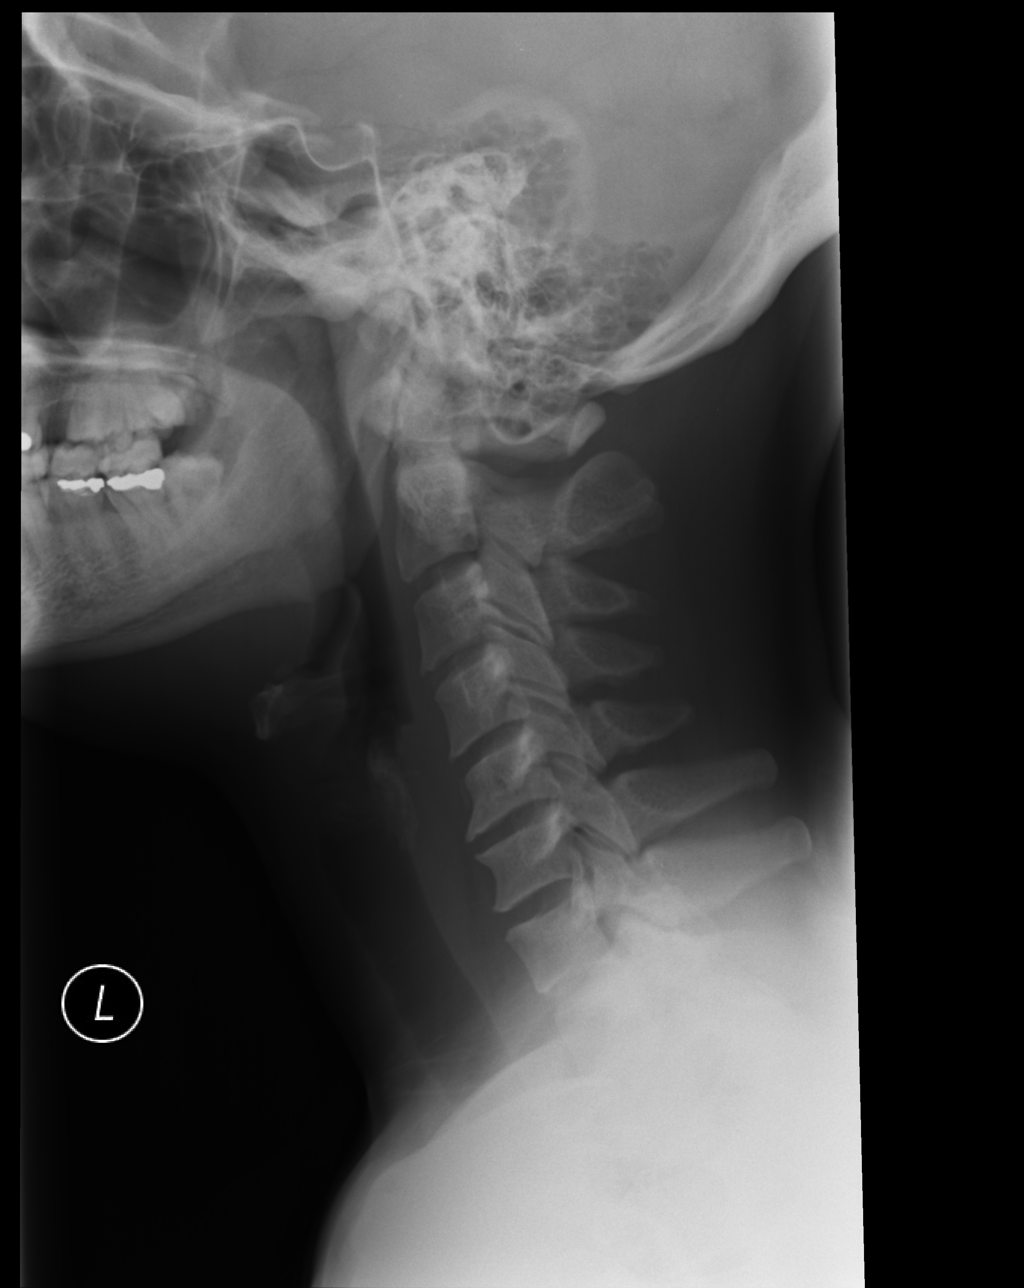

[ap open mouth]
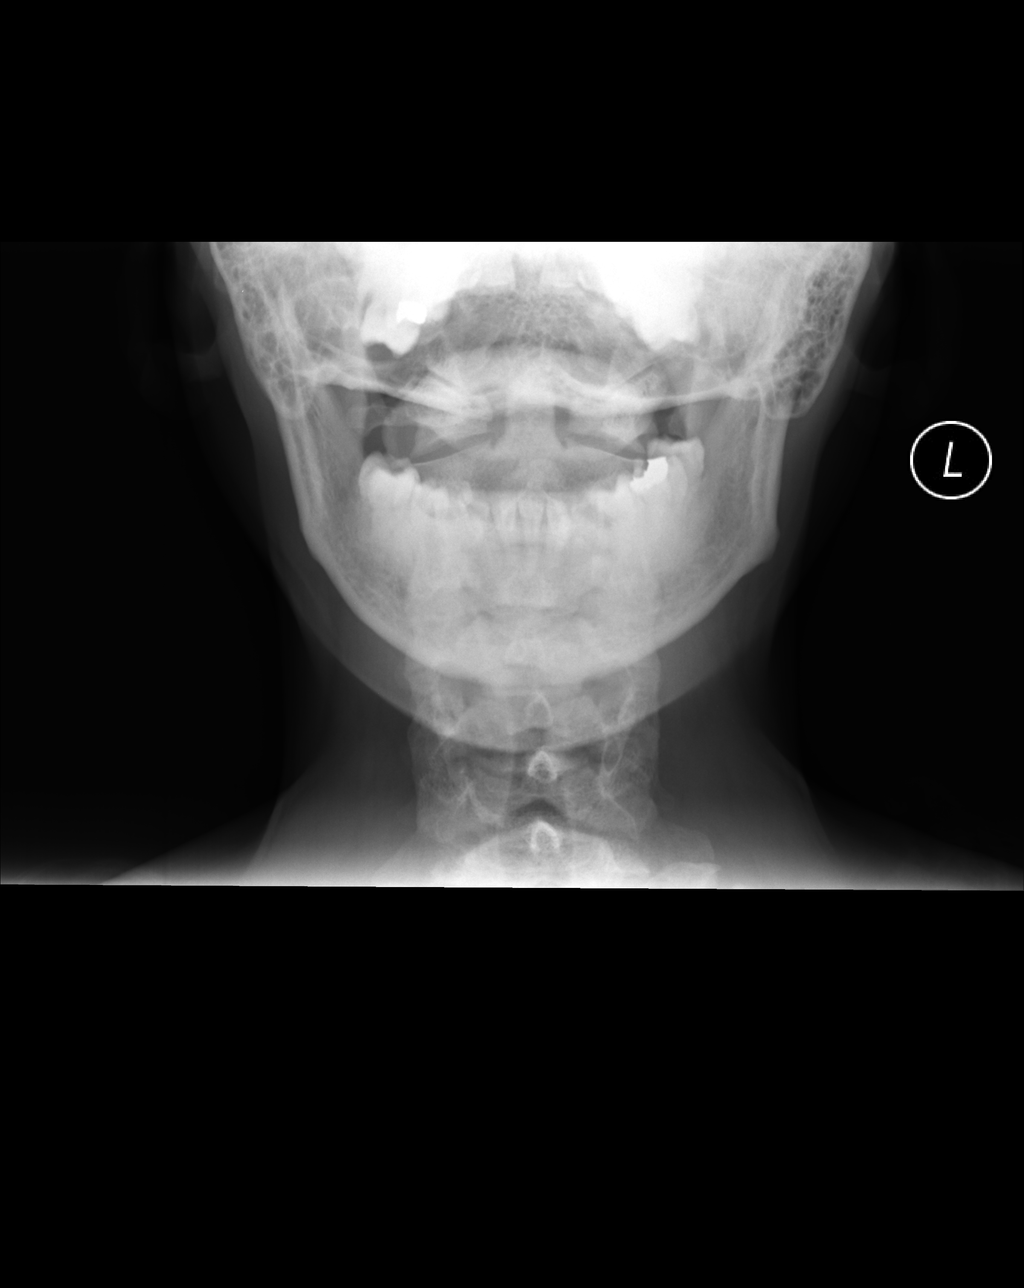

[AP]
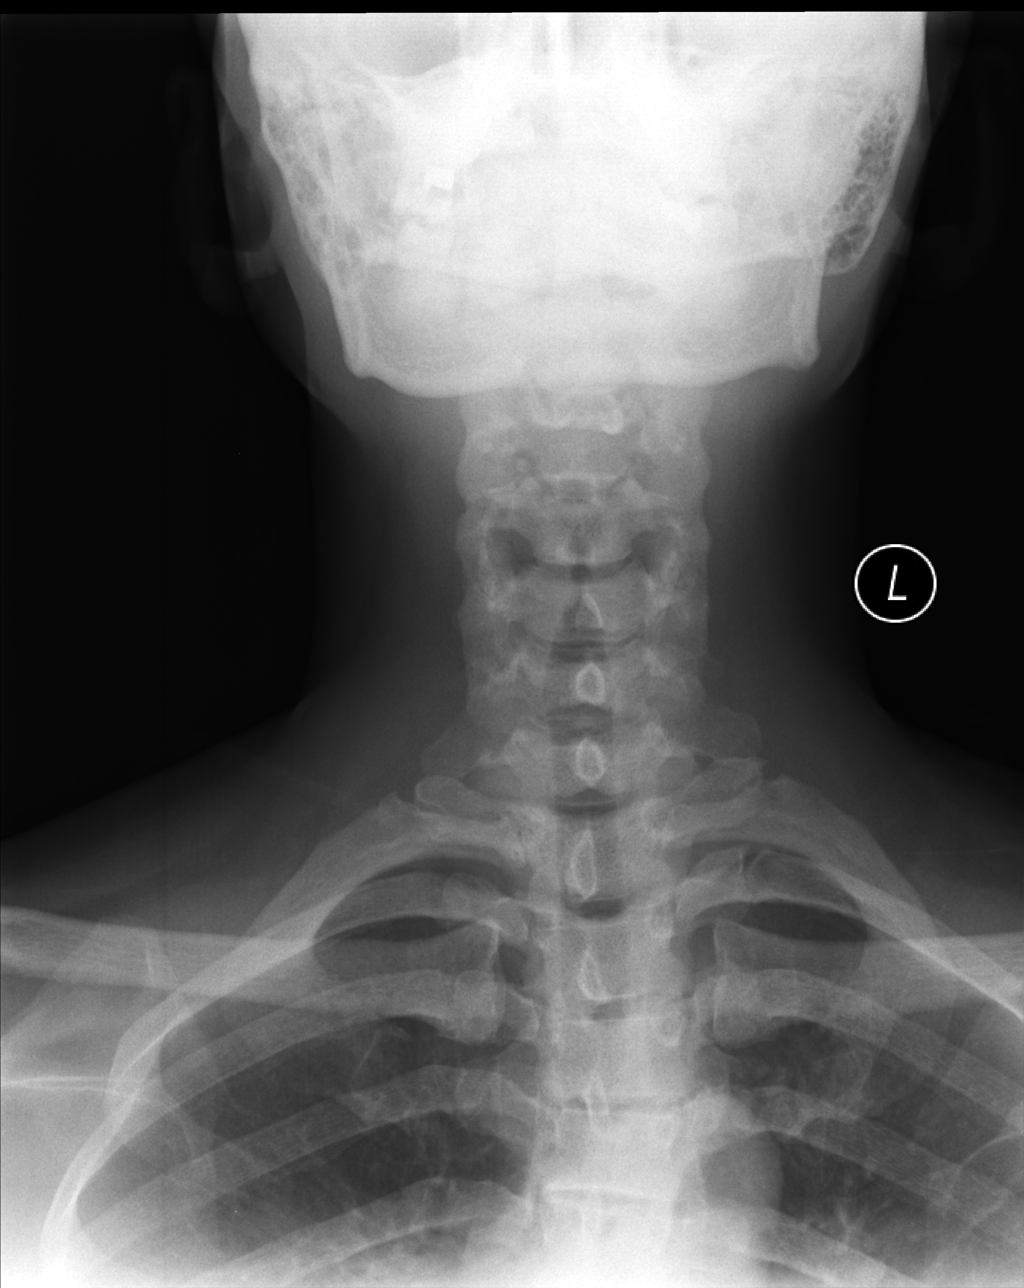

[swimmers]
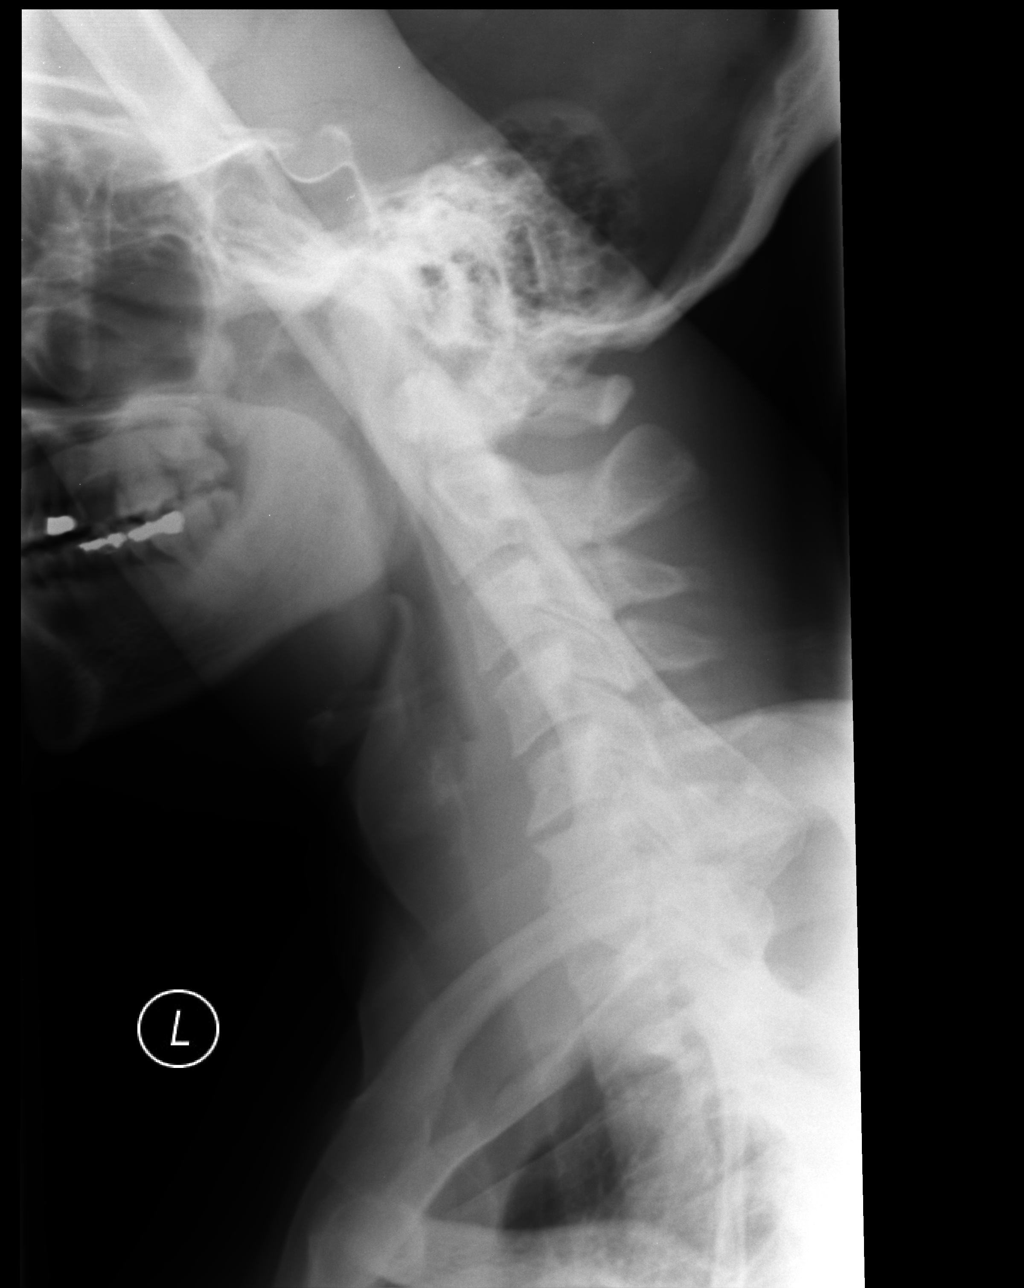

[4 of 4 positions shown; findings below may reference images not displayed]

FINDINGS: Seven cervical segments are well visualized. Vertebral body height
is well maintained. Mild osteophytic changes are noted at C5-6 and
to a lesser degree at C6-7. The odontoid is within normal limits. No
soft tissue changes are noted.
IMPRESSION: Mild degenerative change without acute abnormality.

## 2017-03-27 ENCOUNTER — Encounter (HOSPITAL_COMMUNITY): Payer: Self-pay | Admitting: *Deleted

## 2017-03-27 ENCOUNTER — Ambulatory Visit (HOSPITAL_COMMUNITY)
Admission: EM | Admit: 2017-03-27 | Discharge: 2017-03-27 | Disposition: A | Discharge status: Home / Self Care | Payer: PRIVATE HEALTH INSURANCE | Attending: Family Medicine | Admitting: Family Medicine

## 2017-03-27 DIAGNOSIS — J069 Acute upper respiratory infection, unspecified: Secondary | ICD-10-CM

## 2017-03-27 MED ORDER — HYDROCODONE-HOMATROPINE 5-1.5 MG/5ML PO SYRP: 5.0000 mL | ORAL_SOLUTION | Freq: Four times a day (QID) | ORAL | 0 refills | Status: AC | PRN

## 2017-03-27 NOTE — ED Provider Notes (Signed)
  [facility]   [id number] 03/27/17 Arrival Time: 1015  ASSESSMENT & PLAN:  1. Viral upper respiratory illness    Meds ordered this encounter  Medications  . HYDROcodone-homatropine (HYCODAN) 5-1.5 MG/5ML syrup    Sig: Take 5 mLs by mouth every 6 (six) hours as needed for cough.    Dispense:  90 mL    Refill:  0   Medication sedation precautions given. OTC analgesics and symptom care as needed. Work note given. May f/u as needed. Reviewed expectations re: course of current medical issues. Questions answered. Outlined signs and symptoms indicating need for more acute intervention. Patient verbalized understanding. After Visit Summary given.   SUBJECTIVE:  Kathreen Cosierjeoma Noble is a 46 y.o. female who presents with complaint of nasal congestion, post-nasal drainage, and a dry cough starting yesterday. Acute onset. Husband with similar symptoms beginning several days ago. Afebrile. Overall fatigued. Body aches. No n/v. Normal PO intake. No SOB or wheezing. Tylenol with some help.  ROS: As per HPI.   OBJECTIVE:  Vitals:   03/27/17 1109  BP: 133/69  Pulse: 78  Resp: 16  Temp: 98.5 F (36.9 C)  TempSrc: Oral  SpO2: 100%  Weight: 230 lb (104.3 kg)  Height: 5\' 10"  (1.778 m)    General appearance: alert; no distress HEENT: nasal congestion; clear runny nose; throat irritation secondary to post-nasal drainage Neck: supple without LAD Lungs: clear to auscultation bilaterally; dry cough without wheezing Skin: warm and dry Psychological:  alert and cooperative; normal mood and affect  No results found.  Allergies  Allergen Reactions  . Penicillins Hives  . Ampicillin Hives  . Eggs Or Egg-Derived Products Nausea And Vomiting  . Septra [Bactrim] Hives  . Sulfa Antibiotics Hives   Social History   Social History  . Marital status: Married    Spouse name: N/A  . Number of children: N/A  . Years of education: N/A   Occupational History  . Not on file.   Social  History Main Topics  . Smoking status: Never Smoker  . Smokeless tobacco: Never Used  . Alcohol use No  . Drug use: No  . Sexual activity: Yes   Other Topics Concern  . Not on file   Social History Narrative  . No narrative on file           Mardella LaymanHagler, Robinson Brinkley, MD 03/27/17 1135

## 2017-03-27 NOTE — ED Triage Notes (Signed)
Became    Ill   Yesterday  With     With  Headache   Congestion  Pain  r  Side        sorethroat    Coughing    Fever   releived       By  Tylenol
